# Patient Record
Sex: Male | Born: 1995 | Race: White | Hispanic: No | Smoking: Current every day smoker
Health system: Southern US, Community
[De-identification: ages and names within clinical notes are randomized; demographics above are authoritative.]

---
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
## 2015-03-23 ENCOUNTER — Encounter: Payer: Self-pay | Admitting: Emergency Medicine

## 2015-03-23 ENCOUNTER — Emergency Department
Admission: EM | Admit: 2015-03-23 | Discharge: 2015-03-23 | Disposition: A | Discharge status: Home / Self Care | Payer: Medicaid Other | Attending: Emergency Medicine | Admitting: Emergency Medicine

## 2015-03-23 DIAGNOSIS — F17210 Nicotine dependence, cigarettes, uncomplicated: Secondary | ICD-10-CM | POA: Insufficient documentation

## 2015-03-23 DIAGNOSIS — J028 Acute pharyngitis due to other specified organisms: Secondary | ICD-10-CM | POA: Insufficient documentation

## 2015-03-23 DIAGNOSIS — J029 Acute pharyngitis, unspecified: Secondary | ICD-10-CM

## 2015-03-23 DIAGNOSIS — B9789 Other viral agents as the cause of diseases classified elsewhere: Secondary | ICD-10-CM | POA: Insufficient documentation

## 2015-03-23 LAB — POCT RAPID STREP A: STREPTOCOCCUS, GROUP A SCREEN (DIRECT): NEGATIVE

## 2015-03-23 MED ORDER — PSEUDOEPH-BROMPHEN-DM 30-2-10 MG/5ML PO SYRP: 5.0000 mL | ORAL_SOLUTION | Freq: Four times a day (QID) | ORAL | Status: DC | PRN

## 2015-03-23 MED ORDER — LIDOCAINE VISCOUS 2 % MT SOLN
15.0000 mL | Freq: Once | OROMUCOSAL | Status: AC
  Administered 2015-03-23: 15 mL via OROMUCOSAL
  Filled 2015-03-23: qty 15

## 2015-03-23 MED ORDER — DIPHENHYDRAMINE HCL 12.5 MG/5ML PO ELIX
25.0000 mg | ORAL_SOLUTION | Freq: Once | ORAL | Status: AC
  Administered 2015-03-23: 25 mg via ORAL
  Filled 2015-03-23: qty 10

## 2015-03-23 MED ORDER — LIDOCAINE VISCOUS 2 % MT SOLN: 5.0000 mL | Freq: Four times a day (QID) | OROMUCOSAL | Status: DC | PRN

## 2015-03-23 NOTE — Discharge Instructions (Signed)

## 2015-03-23 NOTE — ED Notes (Signed)
Patient presents to the ED with a sore throat and fever (102 degrees last night).  Patient is in no obvious distress.  Patient denies difficulty breathing, speaking in full sentences without obvious difficulty.  Painful to swallow and eat.

## 2015-03-23 NOTE — ED Provider Notes (Signed)
[facility] Emergency Department Provider Note  ____________________________________________  Time seen: Approximately 8:16 AM  I have reviewed the triage vital signs and the nursing notes.   HISTORY  Chief Complaint Sore Throat and Fever    HPI Francis GreenhouseCody S Barefield is a 19 y.o. male patient complaining of sore throat and fever. Patient stated onset was last night the temperature 102. Patient state he has pain with swallowing. Patient state he is able to tolerate food and fluids but only with discomfort. Patient denies any sinus congestion and ear pressure pain. He denies any nausea vomiting diarrhea. No palliative measures taken for this complaint. Patient rates his pain discomfort as a 5/10.   History reviewed. No pertinent past medical history.  There are no active problems to display for this patient.   History reviewed. No pertinent past surgical history.  Current Outpatient Rx  Name  Route  Sig  Dispense  Refill  . brompheniramine-pseudoephedrine-DM 30-2-10 MG/5ML syrup   Oral   Take 5 mLs by mouth 4 (four) times daily as needed. Mixed with 5 mL of viscous lidocaine for swish and swallow   120 mL   0   . lidocaine (XYLOCAINE) 2 % solution   Mouth/Throat   Use as directed 5 mLs in the mouth or throat every 6 (six) hours as needed for mouth pain. Mixed with 5 mL of Bromfed for swish and swallow   100 mL   0     Allergies Review of patient's allergies indicates no known allergies.  No family history on file.  Social History Social History  Substance Use Topics  . Smoking status: Current Every Day Smoker -- 0.25 packs/day    Types: Cigarettes  . Smokeless tobacco: None  . Alcohol Use: No    Review of Systems Constitutional: Fever Eyes: No visual changes. ENT: Sore throat. Cardiovascular: Denies chest pain. Respiratory: Denies shortness of breath. Gastrointestinal: No abdominal pain.  No nausea, no vomiting.  No diarrhea.  No  constipation. Genitourinary: Negative for dysuria. Musculoskeletal: Negative for back pain. Skin: Negative for rash. Neurological: Negative for headaches, focal weakness or numbness. 10-point ROS otherwise negative.  ____________________________________________   PHYSICAL EXAM:  VITAL SIGNS: ED Triage Vitals  Enc Vitals Group     BP 03/23/15 0742 138/79 mmHg     Pulse Rate 03/23/15 0742 98     Resp 03/23/15 0742 20     Temp 03/23/15 0742 98.2 F (36.8 C)     Temp Source 03/23/15 0742 Oral     SpO2 03/23/15 0742 96 %     Weight 03/23/15 0742 240 lb (108.863 kg)     Height 03/23/15 0742 5\' 10"  (1.778 m)     Head Cir --      Peak Flow --      Pain Score 03/23/15 0743 5     Pain Loc --      Pain Edu? --      Excl. in GC? --     Constitutional: Alert and oriented. Well appearing and in no acute distress. Eyes: Conjunctivae are normal. PERRL. EOMI. Head: Atraumatic. Nose: No congestion/rhinnorhea. Mouth/Throat: Mucous membranes are moist.  Oropharynx erythematous. Neck: No stridor. No cervical spine tenderness to palpation. Hematological/Lymphatic/Immunilogical: No cervical lymphadenopathy. Cardiovascular: Normal rate, regular rhythm. Grossly normal heart sounds.  Good peripheral circulation. Respiratory: Normal respiratory effort.  No retractions. Lungs CTAB. Gastrointestinal: Soft and nontender. No distention. No abdominal bruits. No CVA tenderness. Musculoskeletal: No lower extremity tenderness nor edema.  No  joint effusions. Neurologic:  Normal speech and language. No gross focal neurologic deficits are appreciated. No gait instability. Skin:  Skin is warm, dry and intact. No rash noted. Psychiatric: Mood and affect are normal. Speech and behavior are normal.  ____________________________________________   LABS (all labs ordered are listed, but only abnormal results are displayed)  Labs Reviewed  POCT RAPID STREP A    ____________________________________________  EKG   ____________________________________________  RADIOLOGY   ____________________________________________   PROCEDURES  Procedure(s) performed: None  Critical Care performed: No  ____________________________________________   INITIAL IMPRESSION / ASSESSMENT AND PLAN / ED COURSE  Pertinent labs & imaging results that were available during my care of the patient were reviewed by me and considered in my medical decision making (see chart for details).  Viral pharyngitis. Discussed negative rapid strep test but a virus culture is pending. Patient will be discharged home care instructions and a prescription for and Bromfed and viscous lidocaine. [facility]. ____________________________________________   FINAL CLINICAL IMPRESSION(S) / ED DIAGNOSES  Final diagnoses:  Viral pharyngitis      Joni Reining, PA-C 03/23/15 [id number]  Jene Every, MD 03/23/15 [phone]

## 2015-08-27 ENCOUNTER — Emergency Department
Admission: EM | Admit: 2015-08-27 | Discharge: 2015-08-27 | Disposition: A | Discharge status: Home / Self Care | Payer: Medicaid Other | Attending: Emergency Medicine | Admitting: Emergency Medicine

## 2015-08-27 ENCOUNTER — Encounter: Payer: Self-pay | Admitting: *Deleted

## 2015-08-27 DIAGNOSIS — Y929 Unspecified place or not applicable: Secondary | ICD-10-CM | POA: Insufficient documentation

## 2015-08-27 DIAGNOSIS — Y9389 Activity, other specified: Secondary | ICD-10-CM | POA: Insufficient documentation

## 2015-08-27 DIAGNOSIS — S0502XA Injury of conjunctiva and corneal abrasion without foreign body, left eye, initial encounter: Secondary | ICD-10-CM | POA: Insufficient documentation

## 2015-08-27 DIAGNOSIS — F17210 Nicotine dependence, cigarettes, uncomplicated: Secondary | ICD-10-CM | POA: Insufficient documentation

## 2015-08-27 DIAGNOSIS — Z79899 Other long term (current) drug therapy: Secondary | ICD-10-CM | POA: Insufficient documentation

## 2015-08-27 DIAGNOSIS — Y990 Civilian activity done for income or pay: Secondary | ICD-10-CM | POA: Insufficient documentation

## 2015-08-27 DIAGNOSIS — X58XXXA Exposure to other specified factors, initial encounter: Secondary | ICD-10-CM | POA: Insufficient documentation

## 2015-08-27 MED ORDER — KETOROLAC TROMETHAMINE 0.5 % OP SOLN: 1.0000 [drp] | Freq: Four times a day (QID) | OPHTHALMIC | Status: DC

## 2015-08-27 MED ORDER — TETRACAINE HCL 0.5 % OP SOLN
2.0000 [drp] | Freq: Once | OPHTHALMIC | Status: AC
  Administered 2015-08-27: 2 [drp] via OPHTHALMIC
  Filled 2015-08-27: qty 2

## 2015-08-27 MED ORDER — FLUORESCEIN SODIUM 1 MG OP STRP
1.0000 | ORAL_STRIP | Freq: Once | OPHTHALMIC | Status: AC
  Administered 2015-08-27: 1 via OPHTHALMIC
  Filled 2015-08-27: qty 1

## 2015-08-27 NOTE — ED Notes (Signed)
Pt reports working yesterday and getting a piece of wood in his left, pt reports pain in eye with no vision changes

## 2015-08-27 NOTE — ED Provider Notes (Signed)
[facility] Emergency Department Provider Note  ____________________________________________  Time seen: Approximately 4:37 PM  I have reviewed the triage vital signs and the nursing notes.   HISTORY  Chief Complaint Foreign Body in Eye    HPI Francis Larson is a 20 y.o. male who presents to emergency department complaining of left eye pain. Patient states that he was using a saw to cut wood when he felt like a piece of wood flew into his eye. Patient reports his eye out with copious amounts of water yesterday. He still feels a foreign body sensation to the left upper eye. Patient denies any visual changes. Patient is supposed to wear glasses but states that he does not wear same. He does not wear contacts. Patient denies any other complaints at this time.   History reviewed. No pertinent past medical history.  There are no active problems to display for this patient.   History reviewed. No pertinent past surgical history.  Current Outpatient Rx  Name  Route  Sig  Dispense  Refill  . brompheniramine-pseudoephedrine-DM 30-2-10 MG/5ML syrup   Oral   Take 5 mLs by mouth 4 (four) times daily as needed. Mixed with 5 mL of viscous lidocaine for swish and swallow   120 mL   0   . ketorolac (ACULAR) 0.5 % ophthalmic solution   Right Eye   Place 1 drop into the right eye 4 (four) times daily.   5 mL   0   . lidocaine (XYLOCAINE) 2 % solution   Mouth/Throat   Use as directed 5 mLs in the mouth or throat every 6 (six) hours as needed for mouth pain. Mixed with 5 mL of Bromfed for swish and swallow   100 mL   0     Allergies Review of patient's allergies indicates no known allergies.  No family history on file.  Social History Social History  Substance Use Topics  . Smoking status: Current Every Day Smoker -- 0.25 packs/day    Types: Cigarettes  . Smokeless tobacco: None  . Alcohol Use: No     Review of Systems  Constitutional: No  fever/chills Eyes: No visual changes. Positive for foreign body sensation to the left eye. ENT: No upper respiratory complaints. Cardiovascular: no chest pain. Respiratory: no cough. No SOB. Musculoskeletal: Negative for musculoskeletal pain. Skin: Negative for rash, abrasions, lacerations, ecchymosis. Neurological: Negative for headaches, focal weakness or numbness. 10-point ROS otherwise negative.  ____________________________________________   PHYSICAL EXAM:  VITAL SIGNS: ED Triage Vitals  Enc Vitals Group     BP 08/27/15 1527 132/79 mmHg     Pulse Rate 08/27/15 1527 94     Resp 08/27/15 1527 18     Temp 08/27/15 1527 97.9 F (36.6 C)     Temp Source 08/27/15 1527 Oral     SpO2 08/27/15 1527 100 %     Weight 08/27/15 1527 220 lb (99.791 kg)     Height 08/27/15 1527 5\' 10"  (1.778 m)     Head Cir --      Peak Flow --      Pain Score --      Pain Loc --      Pain Edu? --      Excl. in GC? --      Constitutional: Alert and oriented. Well appearing and in no acute distress. Eyes: Conjunctivae are normal. PERRL. EOMI.Funduscopic exam reveals no acute corneal abnormality. Red reflexes present bilaterally. Vasculature and optic disc are unremarkable bilaterally.  Patient's eye is anesthetized using tetracaine eyedrops. Fluorescein strep is applied. Direct visualization with Wood's lamp reveals a small corneal abrasion in the 1:00 position. No visible foreign body. Head: Atraumatic. Cardiovascular: Normal rate, regular rhythm. Normal S1 and S2.  Good peripheral circulation. Respiratory: Normal respiratory effort without tachypnea or retractions. Lungs CTAB. Good air entry to the bases with no decreased or absent breath sounds. Musculoskeletal: Full range of motion to all extremities. No gross deformities appreciated. Neurologic:  Normal speech and language. No gross focal neurologic deficits are appreciated.  Skin:  Skin is warm, dry and intact. No rash noted. Psychiatric: Mood  and affect are normal. Speech and behavior are normal. Patient exhibits appropriate insight and judgement.   ____________________________________________   LABS (all labs ordered are listed, but only abnormal results are displayed)  Labs Reviewed - No data to display ____________________________________________  EKG   ____________________________________________  RADIOLOGY   No results found.  ____________________________________________    PROCEDURES  Procedure(s) performed:       Medications  fluorescein ophthalmic strip 1 strip (not administered)  tetracaine (PONTOCAINE) 0.5 % ophthalmic solution 2 drop (not administered)     ____________________________________________   INITIAL IMPRESSION / ASSESSMENT AND PLAN / ED COURSE  Pertinent labs & imaging results that were available during my care of the patient were reviewed by me and considered in my medical decision making (see chart for details).  Patient's diagnosis is consistent with Corneal abrasion to the left eye. No visible foreign body at this time. Patient does not have any visual acuity changes.. Patient will be discharged home with prescriptions for Acular eyedrops. Patient is to follow up with ophthalmologist as needed or otherwise directed. Patient is given ED precautions to return to the ED for any worsening or new symptoms.     ____________________________________________  FINAL CLINICAL IMPRESSION(S) / ED DIAGNOSES  Final diagnoses:  Corneal abrasion, left, initial encounter      NEW MEDICATIONS STARTED DURING THIS VISIT:  New Prescriptions   KETOROLAC (ACULAR) 0.5 % OPHTHALMIC SOLUTION    Place 1 drop into the right eye 4 (four) times daily.        This chart was dictated using voice recognition software/Dragon. Despite best efforts to proofread, errors can occur which can change the meaning. Any change was purely unintentional.    Racheal Patches, PA-C 08/27/15  1650  Rockne Menghini, MD 08/27/15 1820

## 2015-08-27 NOTE — ED Notes (Signed)
Pt in via triage; pt reports he was cutting some wood yesterday, getting some in his left eye.  Pt states he feels like something is still in his eye, eye irritated, no itching at this time.

## 2015-08-27 NOTE — Discharge Instructions (Signed)

## 2016-11-20 ENCOUNTER — Ambulatory Visit (HOSPITAL_COMMUNITY)
Admission: EM | Admit: 2016-11-20 | Discharge: 2016-11-20 | Disposition: A | Discharge status: Home / Self Care | Payer: Self-pay | Attending: Family | Admitting: Family

## 2016-11-20 ENCOUNTER — Encounter (HOSPITAL_COMMUNITY): Payer: Self-pay | Admitting: Emergency Medicine

## 2016-11-20 DIAGNOSIS — L0291 Cutaneous abscess, unspecified: Secondary | ICD-10-CM

## 2016-11-20 DIAGNOSIS — L0591 Pilonidal cyst without abscess: Secondary | ICD-10-CM

## 2016-11-20 MED ORDER — CEPHALEXIN 500 MG PO CAPS: 500.0000 mg | ORAL_CAPSULE | Freq: Three times a day (TID) | ORAL | 0 refills | Status: AC

## 2016-11-20 NOTE — Discharge Instructions (Signed)
Start Keflex 500mg  3 times a day as directed. Wash with soap and water. Keep area clean. Recommend referral to Surgeon for further evaluation.

## 2016-11-20 NOTE — ED Triage Notes (Signed)
The patient presented to the [facility] with a complaint of a possible abscess on his buttocks x 4 months. The patient stated that it started bleeding earlier today.

## 2016-11-21 NOTE — ED Provider Notes (Signed)
[facility]    CSN: [id number] Arrival date & time: 11/20/16  1953     History   Chief Complaint Chief Complaint  Patient presents with  . Abscess    HPI Francis Larson is a 21 y.o. male.   21 year old male presents with possible abscess/cyst at the top of his buttock for over a year. Has gotten larger in the past 4 months and started bleeding today and expressing yellowish discharge. Has been occasionally painful, especially when sitting. He denies any fever, GI symptoms or previous abscess/cyst. No chronic health issues except smokes cigarettes daily. No daily medication.    The history is provided by the patient.    History reviewed. No pertinent past medical history.  There are no active problems to display for this patient.   History reviewed. No pertinent surgical history.     Home Medications    Prior to Admission medications   Medication Sig Start Date End Date Taking? Authorizing Provider  cephALEXin (KEFLEX) 500 MG capsule Take 1 capsule (500 mg total) by mouth 3 (three) times daily. 11/20/16 11/30/16  Sudie Grumbling, NP    Family History History reviewed. No pertinent family history.  Social History Social History  Substance Use Topics  . Smoking status: Current Every Day Smoker    Packs/day: 0.25    Types: Cigarettes  . Smokeless tobacco: Not on file  . Alcohol use No     Allergies   Patient has no known allergies.   Review of Systems Review of Systems  Constitutional: Negative for activity change, appetite change, chills, fatigue and fever.  Gastrointestinal: Positive for rectal pain. Negative for abdominal pain, anal bleeding, blood in stool, constipation, diarrhea, nausea and vomiting.  Genitourinary: Negative for decreased urine volume, difficulty urinating, discharge, dysuria, flank pain, genital sores, hematuria, penile pain, penile swelling and testicular pain.  Musculoskeletal: Negative for arthralgias, back pain,  myalgias and neck pain.  Skin: Positive for wound. Negative for rash.  Allergic/Immunologic: Negative for immunocompromised state.  Neurological: Negative for dizziness, syncope, weakness, light-headedness and headaches.  Hematological: Negative for adenopathy. Does not bruise/bleed easily.     Physical Exam Triage Vital Signs ED Triage Vitals  Enc Vitals Group     BP 11/20/16 2024 124/66     Pulse Rate 11/20/16 2024 97     Resp 11/20/16 2024 20     Temp 11/20/16 2024 98.6 F (37 C)     Temp Source 11/20/16 2024 Oral     SpO2 11/20/16 2024 100 %     Weight --      Height --      Head Circumference --      Peak Flow --      Pain Score 11/20/16 2022 5     Pain Loc --      Pain Edu? --      Excl. in GC? --    No data found.   Updated Vital Signs BP 124/66 (BP Location: Right Arm)   Pulse 97   Temp 98.6 F (37 C) (Oral)   Resp 20   SpO2 100%   Visual Acuity Right Eye Distance:   Left Eye Distance:   Bilateral Distance:    Right Eye Near:   Left Eye Near:    Bilateral Near:     Physical Exam  Constitutional: He is oriented to person, place, and time. He appears well-developed and well-nourished. No distress.  HENT:  Head: Normocephalic and atraumatic.  Eyes:  Conjunctivae and EOM are normal.  Neck: Normal range of motion.  Cardiovascular: Normal rate.   Pulmonary/Chest: Effort normal.  Abdominal: Soft. Bowel sounds are normal. There is no tenderness. There is no rigidity, no rebound, no guarding and no CVA tenderness. No hernia.  Musculoskeletal: He exhibits tenderness.       Lumbar back: He exhibits swelling and pain.       Back:  1.5cm raised, red oval cyst present on left side of upper buttock. Draining slightly bloody thin discharge from center. Minimal discharge expressed at this time. Very tender. Some firmness around cyst- unable to determine depth. Minimal erythema around cyst. Applied Bacitracin ointment and covered with gauze.   Neurological: He is  alert and oriented to person, place, and time. He has normal strength. No sensory deficit.  Skin: Skin is warm and dry. No rash noted. There is erythema.  Psychiatric: He has a normal mood and affect. His behavior is normal.     UC Treatments / Results  Labs (all labs ordered are listed, but only abnormal results are displayed) Labs Reviewed - No data to display  EKG  EKG Interpretation None       Radiology No results found.  Procedures Procedures (including critical care time)  Medications Ordered in UC Medications - No data to display   Initial Impression / Assessment and Plan / UC Course  I have reviewed the triage vital signs and the nursing notes.  Pertinent labs & imaging results that were available during my care of the patient were reviewed by me and considered in my medical decision making (see chart for details).    Discussed with patient that cyst is draining so no I & D needed at this time. Recommend start Keflex  500mg  3 times a day as directed. Wash area with soap and water. Keep area clean. Discussed that this may be a pilonidal cyst or other deeper cyst that will need to be removed by a Surgeon to help decrease chance of recurrence. Recommend referral to [facility] for further evaluation.  Final Clinical Impressions(s) / UC Diagnoses   Final diagnoses:  Abscess  Cyst near tailbone    New Prescriptions Discharge Medication List as of 11/20/2016  9:16 PM    START taking these medications   Details  cephALEXin (KEFLEX) 500 MG capsule Take 1 capsule (500 mg total) by mouth 3 (three) times daily., Starting Sun 11/20/2016, Until Wed 11/30/2016, Normal         Controlled Substance Prescriptions [place] Controlled Substance Registry consulted? Not Applicable   Sudie Grumbling, NP 11/21/16 1001
